# Patient Record
Sex: Male | Born: 1970
Health system: Southern US, Community
[De-identification: ages and names within clinical notes are randomized; demographics above are authoritative.]

## PROBLEM LIST (undated history)

## (undated) DIAGNOSIS — E119 Type 2 diabetes mellitus without complications: Secondary | ICD-10-CM

## (undated) DIAGNOSIS — I10 Essential (primary) hypertension: Secondary | ICD-10-CM

## (undated) HISTORY — PX: ANKLE FRACTURE SURGERY: SHX122

## (undated) HISTORY — PX: OTHER SURGICAL HISTORY: SHX169

---

## 2006-02-05 ENCOUNTER — Emergency Department: Payer: Self-pay | Admitting: Emergency Medicine

## 2006-02-08 ENCOUNTER — Ambulatory Visit: Payer: Self-pay | Admitting: Podiatry

## 2009-02-14 ENCOUNTER — Emergency Department: Payer: Self-pay | Admitting: Internal Medicine

## 2010-04-19 ENCOUNTER — Ambulatory Visit: Payer: Self-pay | Admitting: Family Medicine

## 2013-01-17 ENCOUNTER — Ambulatory Visit: Payer: Self-pay | Admitting: Emergency Medicine

## 2014-12-02 ENCOUNTER — Ambulatory Visit
Admission: EM | Admit: 2014-12-02 | Discharge: 2014-12-02 | Disposition: A | Discharge status: Home / Self Care | Payer: Managed Care, Other (non HMO) | Attending: Internal Medicine | Admitting: Internal Medicine

## 2014-12-02 ENCOUNTER — Encounter: Payer: Self-pay | Admitting: Emergency Medicine

## 2014-12-02 DIAGNOSIS — L237 Allergic contact dermatitis due to plants, except food: Secondary | ICD-10-CM

## 2014-12-02 DIAGNOSIS — H00016 Hordeolum externum left eye, unspecified eyelid: Secondary | ICD-10-CM

## 2014-12-02 DIAGNOSIS — L0100 Impetigo, unspecified: Secondary | ICD-10-CM

## 2014-12-02 MED ORDER — SULFAMETHOXAZOLE-TRIMETHOPRIM 800-160 MG PO TABS: 1.0000 | ORAL_TABLET | Freq: Two times a day (BID) | ORAL | Status: AC

## 2014-12-02 MED ORDER — PREDNISONE 50 MG PO TABS: 50.0000 mg | ORAL_TABLET | Freq: Every day | ORAL | Status: AC

## 2014-12-02 NOTE — ED Notes (Signed)
Pt reports L eye itching today, rubbed it but getting worse, L eye redness, lid closed more than R eye. Pt reports getting headache. Pt concerned poison ivy in eye as he has rash on bilat arms from poison ivy exposure.

## 2014-12-02 NOTE — ED Provider Notes (Signed)
CSN: [id number]     Arrival date & time 12/02/14  1713 History   None    Chief Complaint  Patient presents with  . Eye Problem   HPI  Patient had some itching in the left eye area in the last 24 hours, has noticed some puffiness of the left upper lid and a pimple-like area laterally. He was cutting trees this weekend, and woke up Sunday morning with blistery itchy patches on his forearms, and neck. No change in vision, no vision loss. No eye watering or mattering noted discharge. No fever, no malaise.  No past medical history on file. Past Surgical History  Procedure Laterality Date  . Arm surgery Bilateral     repair arm fractures   . Ankle fracture surgery Left    History reviewed. No pertinent family history. History  Substance Use Topics  . Smoking status: Never Smoker   . Smokeless tobacco: Not on file  . Alcohol Use: No    Review of Systems  All other systems reviewed and are negative.   Allergies  Poison ivy extract  Home Medications no home medications    Prior to Admission medications                         BP 145/97 mmHg  Pulse 90  Temp(Src) 98 F (36.7 C) (Oral)  Resp 18  Ht 5\' 7"  (1.702 m)  Wt 190 lb (86.183 kg)  BMI 29.75 kg/m2  SpO2 99% Physical Exam  Constitutional: He is oriented to person, place, and time. No distress.  Alert, nicely groomed  HENT:  Head: Atraumatic.  Eyes:  Conjugate gaze, no conjunctival redness/drainage Left upper lid is slightly puffy, especially laterally, with mild tenderness to touch. It is pointing along the lateral lid margin.  No blepharospasm   Neck: Neck supple.  Cardiovascular: Normal rate.   Pulmonary/Chest: No respiratory distress.  Abdominal: Soft. He exhibits no distension.  Musculoskeletal: Normal range of motion.  Neurological: He is alert and oriented to person, place, and time.  Skin: Skin is warm and dry.  No cyanosis Red, vesicular patches scattered over the forearms, one patch on the ventral  right forearm has some golden crusting. No facial rash.  Nursing note and vitals reviewed.   ED Course  Procedures (including critical care time) Labs Review Labs Reviewed - No data to display  Imaging Review No results found.   MDM   1. Stye external, left   2. Poison ivy dermatitis   3. Impetigo    Prescriptions for Bactrim and prednisone sent to the pharmacy. Discussed using warm packs to the left eye for 5-10 minutes twice a day, to promote drainage from the stye. Recheck if stye is not improving in a few days, or if worsening redness/drainage/swelling/pain observed.    Eustace MooreLaura W Aniyah Nobis, MD 12/02/14 [phone]

## 2014-12-02 NOTE — Discharge Instructions (Signed)
Warm packs to L upper eyelid 5-10 minutes twice daily, to help with drainage.  Prescription for bactrim (antibiotic) and prednisone (steroid) have been sent to the pharmacy.   Bactrim should be helpful for slightly infected poison ivy on arms, as well as the stye.  Sty A sty (hordeolum) is an infection of a gland in the eyelid located at the base of the eyelash. A sty may develop a white or yellow head of pus. It can be puffy (swollen). Usually, the sty will burst and pus will come out on its own. They do not leave lumps in the eyelid once they drain. A sty is often confused with another form of cyst of the eyelid called a chalazion. Chalazions occur within the eyelid and not on the edge where the bases of the eyelashes are. They often are red, sore and then form firm lumps in the eyelid. CAUSES   Germs (bacteria).  Lasting (chronic) eyelid inflammation. SYMPTOMS   Tenderness, redness and swelling along the edge of the eyelid at the base of the eyelashes.  Sometimes, there is a white or yellow head of pus. It may or may not drain. DIAGNOSIS  An ophthalmologist will be able to distinguish between a sty and a chalazion and treat the condition appropriately.  TREATMENT   Styes are typically treated with warm packs (compresses) until drainage occurs.  In rare cases, medicines that kill germs (antibiotics) may be prescribed. These antibiotics may be in the form of drops, cream or pills.  If a hard lump has formed, it is generally necessary to do a small incision and remove the hardened contents of the cyst in a minor surgical procedure done in the office.  In suspicious cases, your caregiver may send the contents of the cyst to the lab to be certain that it is not a rare, but dangerous form of cancer of the glands of the eyelid. HOME CARE INSTRUCTIONS   Wash your hands often and dry them with a clean towel. Avoid touching your eyelid. This may spread the infection to other parts of the  eye.  Apply heat to your eyelid for 10 to 20 minutes, several times a day, to ease pain and help to heal it faster.  Do not squeeze the sty. Allow it to drain on its own. Wash your eyelid carefully 3 to 4 times per day to remove any pus. SEEK IMMEDIATE MEDICAL CARE IF:   Your eye becomes painful or puffy (swollen).  Your vision changes.  Your sty does not drain by itself within 3 days.  Your sty comes back within a short period of time, even with treatment.  You have redness (inflammation) around the eye.  You have a fever. Document Released: 03/28/2005 Document Revised: 09/10/2011 Document Reviewed: 10/02/2013 [facility] Patient Information 2015 [employer], [place]. This information is not intended to replace advice given to you by your health care provider. Make sure you discuss any questions you have with your health care provider.

## 2015-03-04 IMAGING — CR RIGHT FOOT COMPLETE - 3+ VIEW
1 series · 3 of 3 positions shown · non-contrast
Comparison: none

REASON FOR EXAM: R foot pain
COMMENTS:

PROCEDURE:     MDR - MDR FOOT RT COMP W/OBLIQUES  - January 17, 2013  [DATE]
RESULT:     Comparison:  None

[Series 1: ap · 0.17mm/px · 3 of 3 slices shown]
[im 1/3]
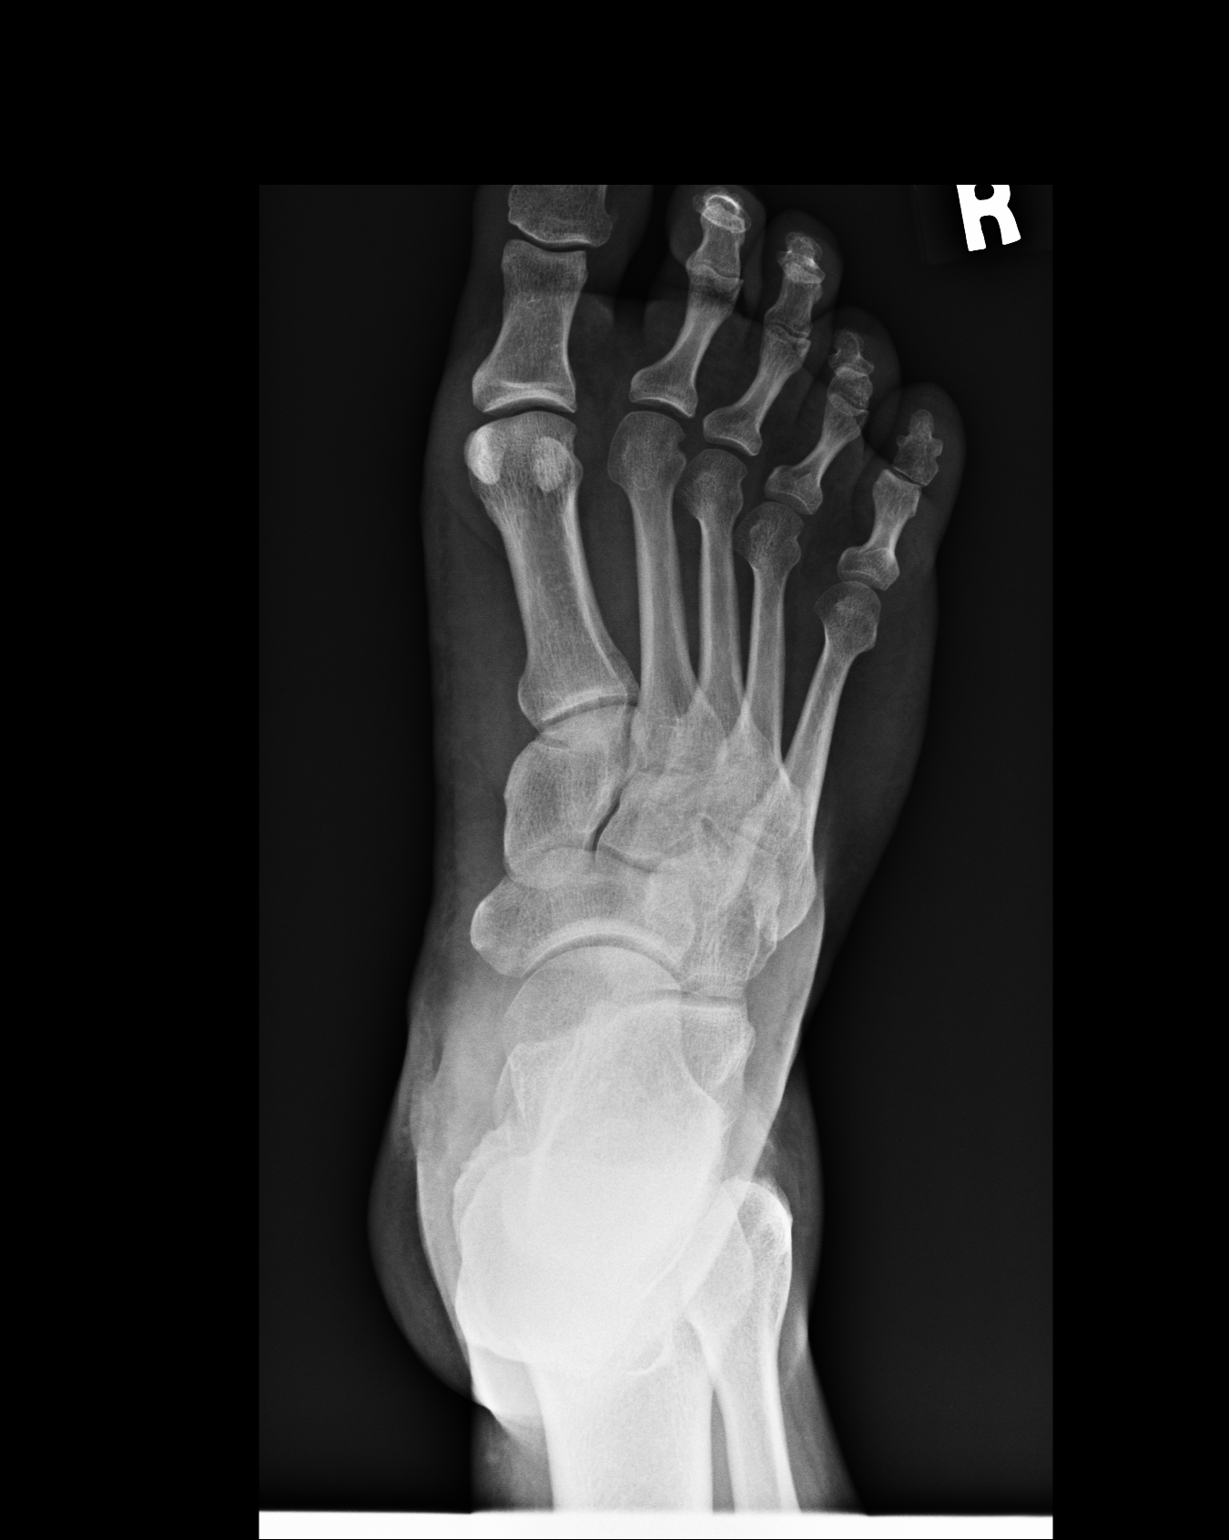
[im 2/3]
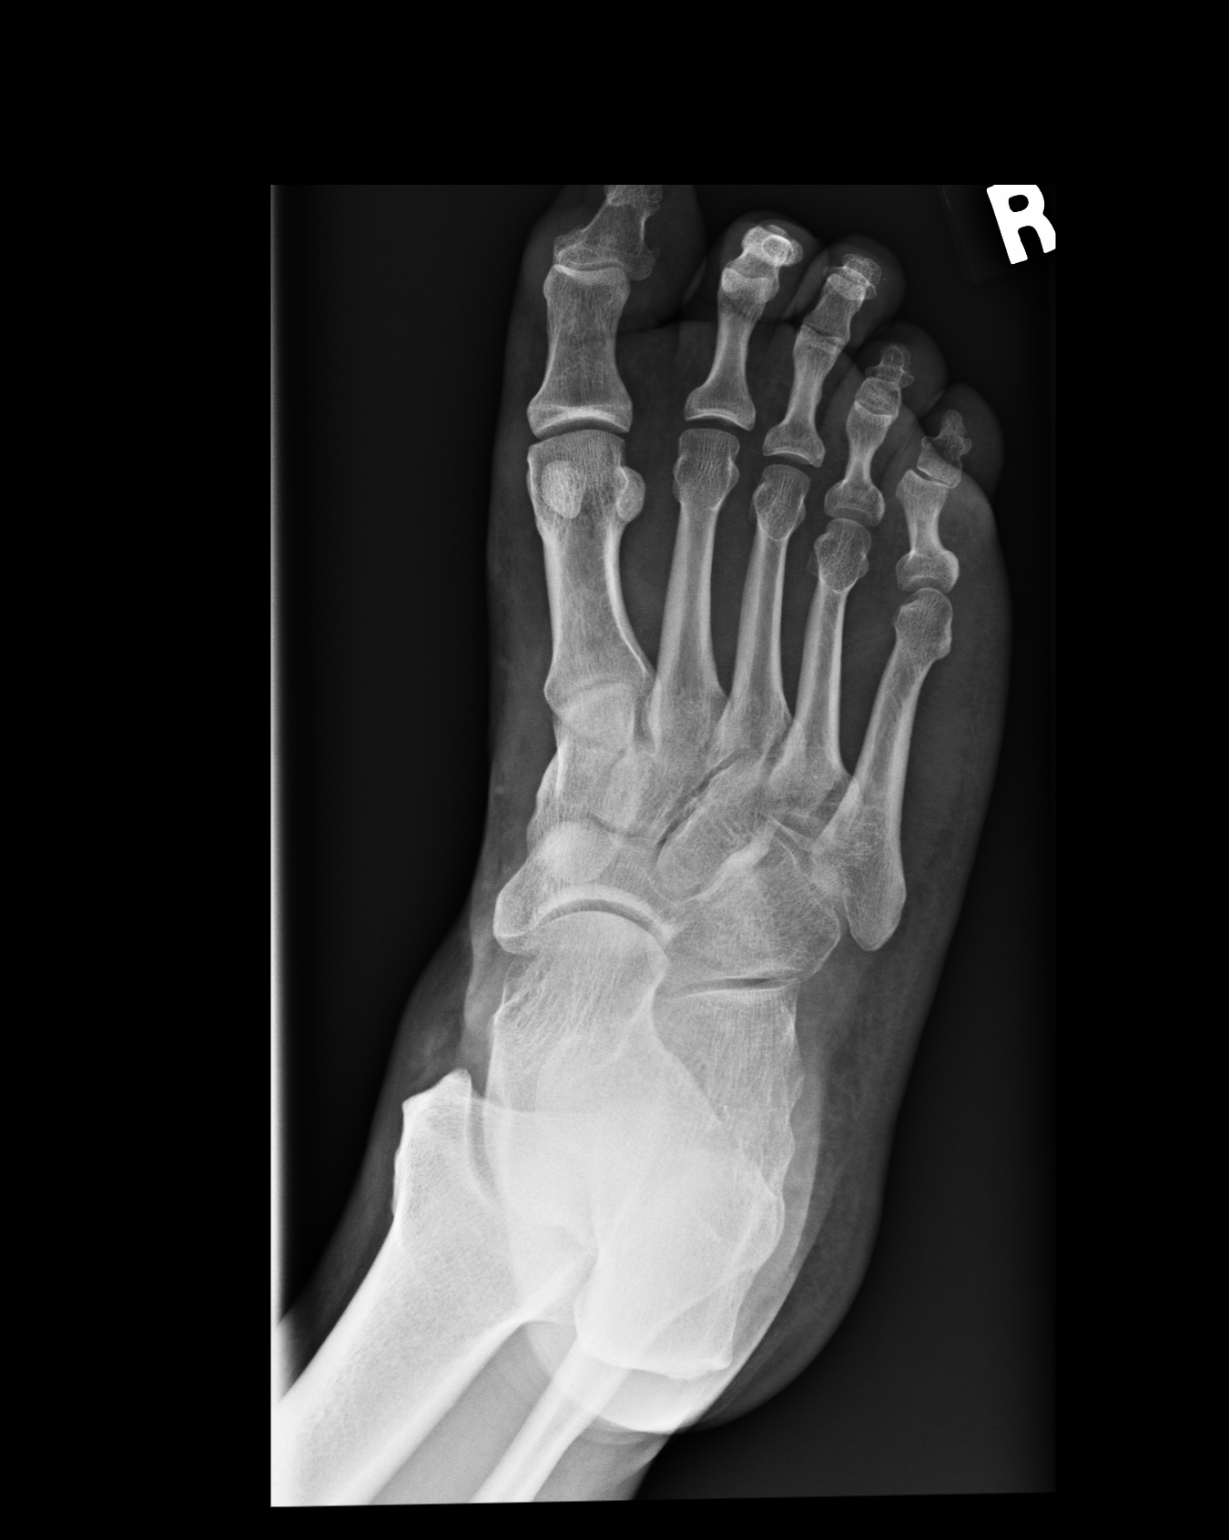
[im 3/3]
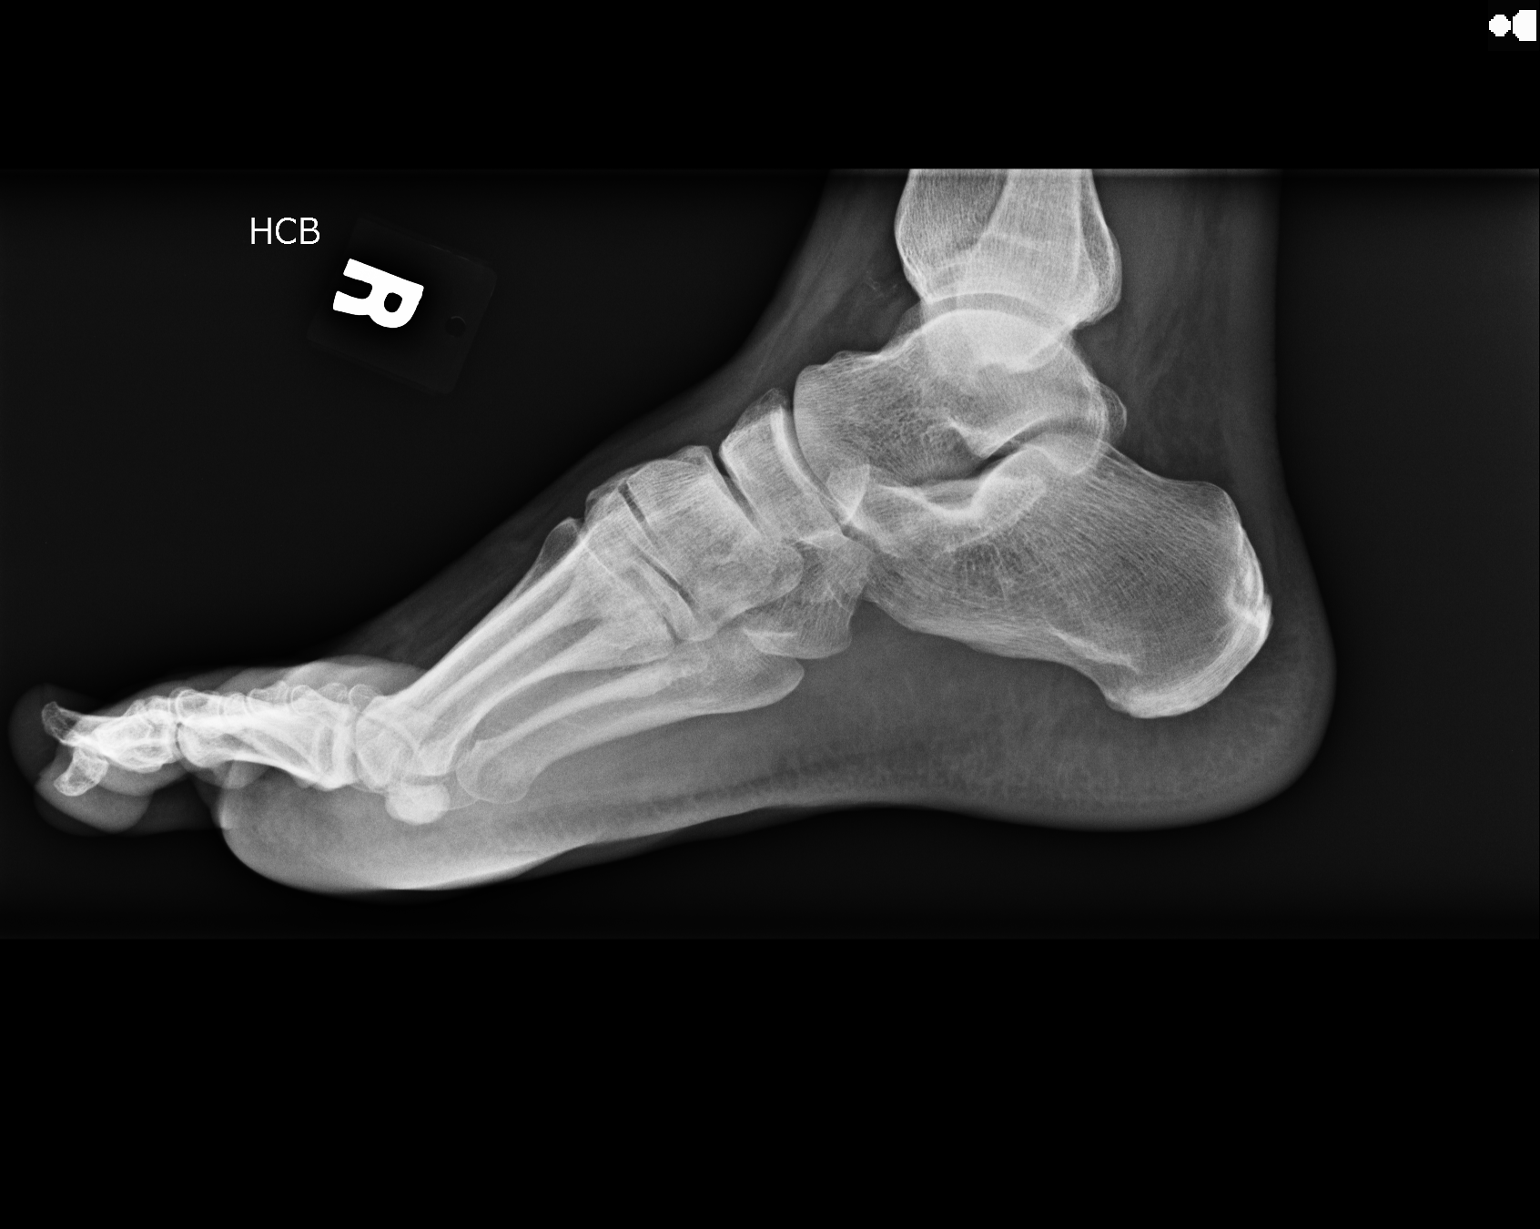

[3 of 3 positions shown; findings below may reference images not displayed]

FINDINGS: AP, oblique, and lateral views of the right foot demonstrates no fracture or
dislocation. There is no soft tissue abnormality. There is no subcutaneous
emphysema or radiopaque foreign bodies.
IMPRESSION: No acute osseous injury of the right foot.

[REDACTED]

## 2015-03-04 IMAGING — CR RIGHT ANKLE - COMPLETE 3+ VIEW
1 series · 5 of 5 positions shown · non-contrast
Comparison: none

REASON FOR EXAM: pain
COMMENTS:

PROCEDURE:     MDR - MDR ANKLE RIGHT COMPLETE  - January 17, 2013  [DATE]
RESULT:     Comparison: None

[Series 1: ap · 0.17mm/px · 5 of 5 slices shown]
[im 1/5]
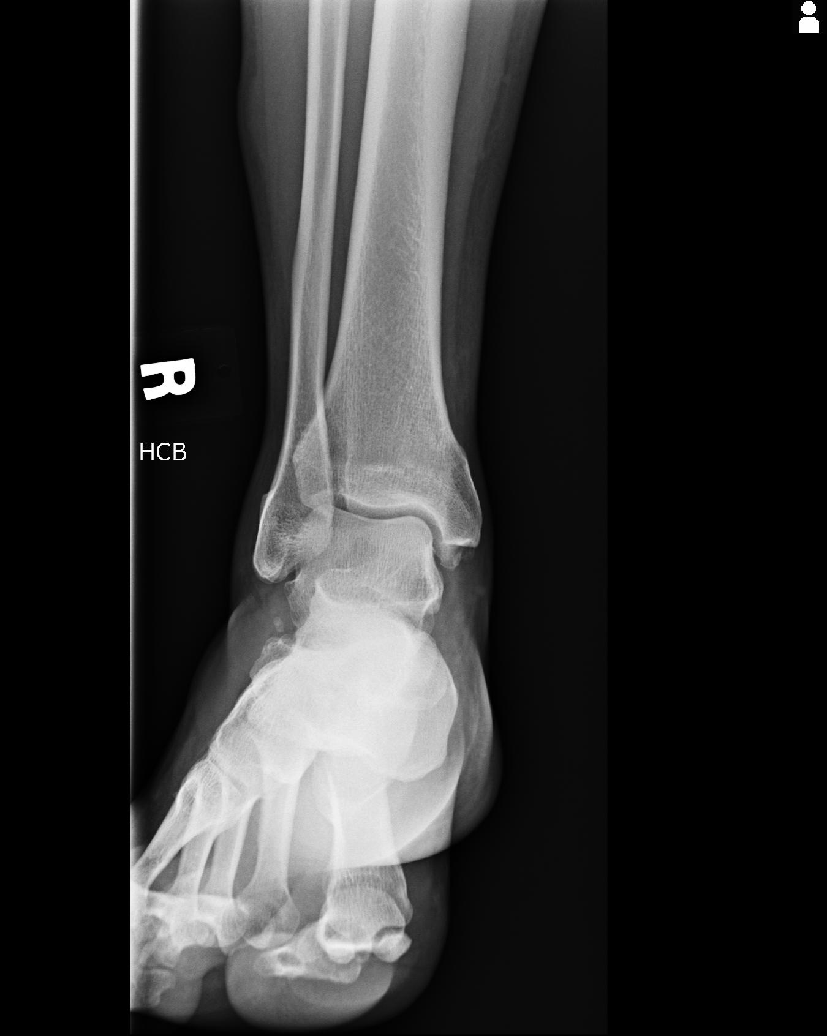
[im 2/5]
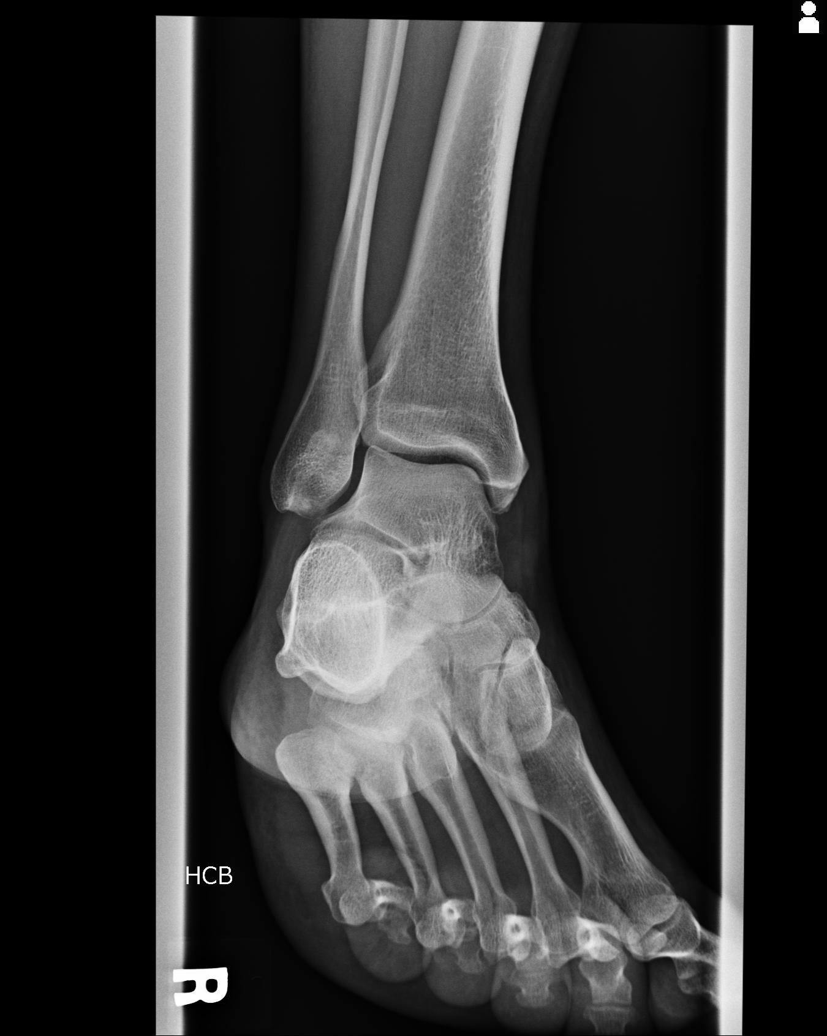
[im 3/5]
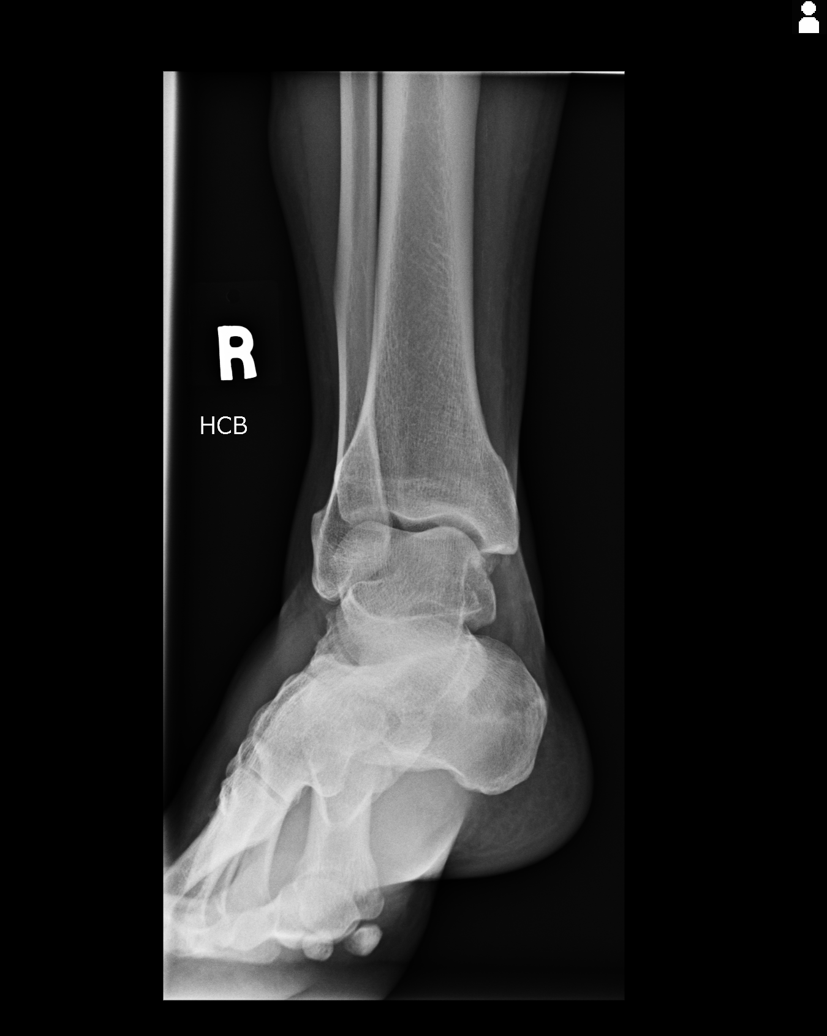
[im 4/5]
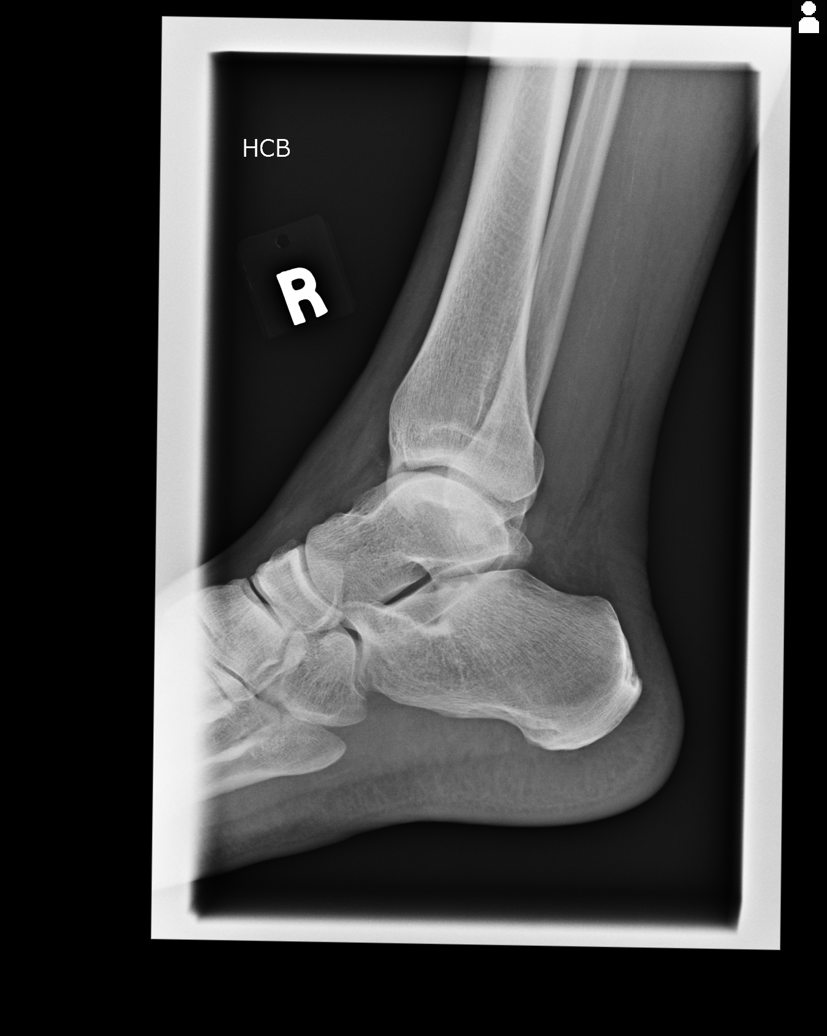
[im 5/5]
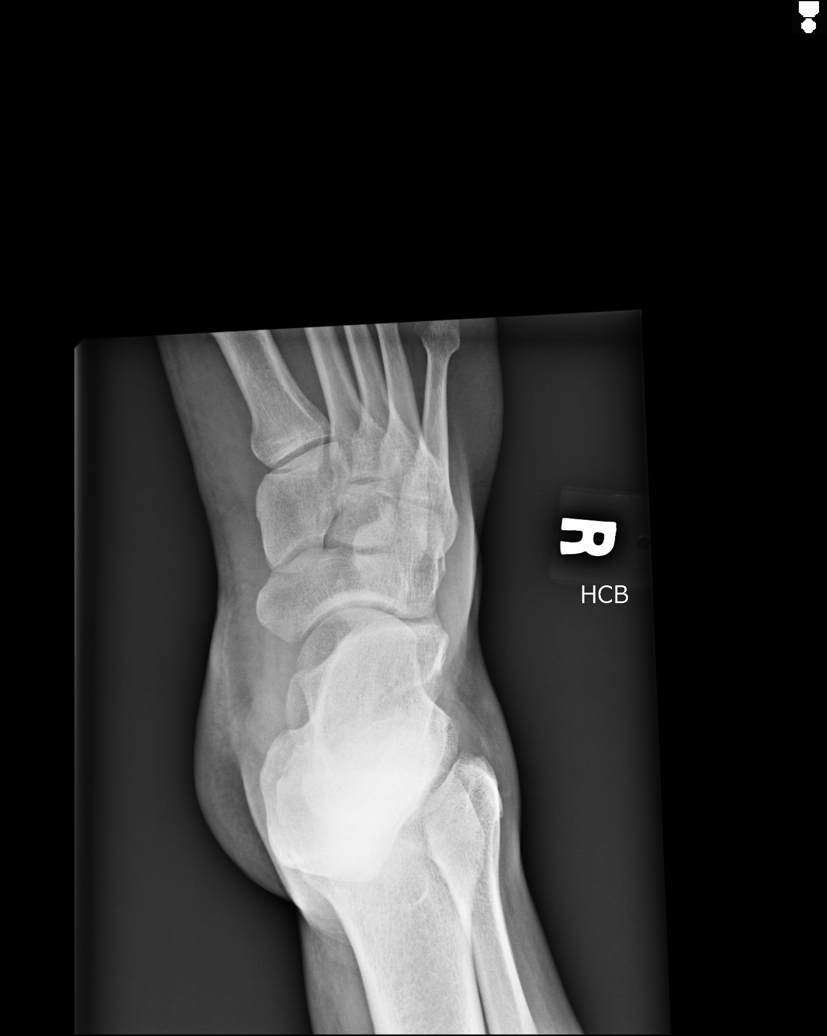

[5 of 5 positions shown; findings below may reference images not displayed]

FINDINGS: 5 views of the right ankle demonstrate no fracture or dislocation. There
ankle mortise is intact. There is no significant joint effusion. The soft
tissues are normal.
IMPRESSION: No acute osseous injury of the right ankle.

[REDACTED]

## 2018-07-02 DIAGNOSIS — E119 Type 2 diabetes mellitus without complications: Secondary | ICD-10-CM | POA: Diagnosis not present

## 2018-08-02 DIAGNOSIS — E119 Type 2 diabetes mellitus without complications: Secondary | ICD-10-CM | POA: Diagnosis not present

## 2018-08-31 DIAGNOSIS — E119 Type 2 diabetes mellitus without complications: Secondary | ICD-10-CM | POA: Diagnosis not present

## 2018-10-01 DIAGNOSIS — E119 Type 2 diabetes mellitus without complications: Secondary | ICD-10-CM | POA: Diagnosis not present

## 2019-07-13 ENCOUNTER — Telehealth: Payer: Self-pay | Admitting: Nurse Practitioner

## 2019-07-13 ENCOUNTER — Ambulatory Visit
Admission: EM | Admit: 2019-07-13 | Discharge: 2019-07-13 | Disposition: A | Discharge status: Home / Self Care | Payer: Commercial Managed Care - PPO | Attending: Emergency Medicine | Admitting: Emergency Medicine

## 2019-07-13 ENCOUNTER — Other Ambulatory Visit: Payer: Self-pay

## 2019-07-13 DIAGNOSIS — U071 COVID-19: Secondary | ICD-10-CM

## 2019-07-13 HISTORY — DX: Type 2 diabetes mellitus without complications: E11.9

## 2019-07-13 HISTORY — DX: Essential (primary) hypertension: I10

## 2019-07-13 LAB — SARS CORONAVIRUS 2 AG (30 MIN TAT): SARS Coronavirus 2 Ag: POSITIVE — AB

## 2019-07-13 MED ORDER — BENZONATATE 200 MG PO CAPS: 200.0000 mg | ORAL_CAPSULE | Freq: Three times a day (TID) | ORAL | 0 refills | Status: DC | PRN

## 2019-07-13 MED ORDER — HYDROCOD POLST-CPM POLST ER 10-8 MG/5ML PO SUER: 5.0000 mL | Freq: Two times a day (BID) | ORAL | 0 refills | Status: DC | PRN

## 2019-07-13 MED ORDER — FLUTICASONE PROPIONATE 50 MCG/ACT NA SUSP: 2.0000 | Freq: Every day | NASAL | 0 refills | Status: DC

## 2019-07-13 MED ORDER — IBUPROFEN 600 MG PO TABS: 600.0000 mg | ORAL_TABLET | Freq: Four times a day (QID) | ORAL | 0 refills | Status: DC | PRN

## 2019-07-13 NOTE — Telephone Encounter (Signed)
Called to Discuss with patient about Covid symptoms and the use of bamlanivimab, a monoclonal antibody infusion for those with mild to moderate Covid symptoms and at a high risk of hospitalization.     Pt is qualified for this infusion at the [facility] infusion center due to co-morbid conditions and/or a member of an at-risk group.       Patient declines infusion at this time. Symptoms tier reviewed as well as criteria for ending isolation. Preventative practices reviewed. Patient verbalized understanding.    Patient advised to call back if he decides that he does want to get infusion. Callback number to the infusion center given. Patient advised to go to Urgent care or ED with severe symptoms.    Symptoms started 07/12/19

## 2019-07-13 NOTE — Discharge Instructions (Addendum)
Flonase, Mucinex, saline nasal irrigation with a Lloyd Huger med rinse and distilled water as often as you want, ibuprofen 600 mg combined with 1 g of Tylenol 3-4 times a day as needed, Tessalon for the cough during the day, Tussionex for the cough at night.  Someone from the outpatient infusion clinic will be getting in touch with you to arrange an appointment if you are interested.

## 2019-07-13 NOTE — ED Provider Notes (Signed)
HPI  SUBJECTIVE:  Juan Alexander is a 49 y.o. male who presents with cough, body aches, headaches, fatigue, nasal congestion, loss of sense of smell for the past 4 days.  Denies fevers, sore throat, loss of sense of taste or shortness of breath, nausea, vomiting, diarrhea, abdominal pain.  He is not able to sleep at night secondary to the cough.  States that he has had several coworkers tested positive for [employer].  Last exposure was a week and a half ago.  He has been taking NyQuil, Mucinex D and tried ibuprofen 400 mg this morning.  The Mucinex D helps.  No aggravating factors.  No antipyretic in the past 4 to 6 hours.  No decongestants today.  He got a flu shot today.  He has a past medical history of diabetes, hypertension.  No history of coronary disease, chronic kidney disease, pulmonary disease, smoking, HIV, cancer, immunocompromise.  PMD: Dr. Payton Mccallum At [facility]    Past Medical History:  Diagnosis Date  . Diabetes mellitus without complication ([place])   . Hypertension     Past Surgical History:  Procedure Laterality Date  . ANKLE FRACTURE SURGERY Left   . arm surgery Bilateral    repair arm fractures     Family History  Problem Relation Age of Onset  . Healthy Mother   . Healthy Father     Social History   Tobacco Use  . Smoking status: Never Smoker  . Smokeless tobacco: Never Used  Substance Use Topics  . Alcohol use: No  . Drug use: Not on file    No current facility-administered medications for this encounter.  Current Outpatient Medications:  .  Cholecalciferol 50 MCG (2000 UT) CAPS, Take 2 capsules by mouth daily., Disp: , Rfl:  .  cyanocobalamin 1000 MCG tablet, Take 1 tablet by mouth daily., Disp: , Rfl:  .  empagliflozin (JARDIANCE) 25 MG TABS tablet, Take 25 mg by mouth daily with breakfast., Disp: , Rfl:  .  fenofibrate (TRICOR) 145 MG tablet, Take 1 tablet by mouth daily., Disp: , Rfl:  .  losartan (COZAAR) 25 MG tablet, Take 1 tablet by mouth  daily., Disp: , Rfl:  .  metFORMIN (GLUCOPHAGE) 1000 MG tablet, Take 1 tablet by mouth 2 (two) times daily with a meal., Disp: , Rfl:  .  Omega-3 Fatty Acids (FISH OIL) 1000 MG CAPS, Take 2 capsules by mouth 2 (two) times daily., Disp: , Rfl:  .  atorvastatin (LIPITOR) 40 MG tablet, Take 40 mg by mouth daily., Disp: , Rfl:  .  benzonatate (TESSALON) 200 MG capsule, Take 1 capsule (200 mg total) by mouth 3 (three) times daily as needed for cough., Disp: 30 capsule, Rfl: 0 .  chlorpheniramine-HYDROcodone (TUSSIONEX PENNKINETIC ER) 10-8 MG/5ML SUER, Take 5 mLs by mouth every 12 (twelve) hours as needed for cough., Disp: 60 mL, Rfl: 0 .  fluticasone (FLONASE) 50 MCG/ACT nasal spray, Place 2 sprays into both nostrils daily., Disp: 16 g, Rfl: 0 .  ibuprofen (ADVIL) 600 MG tablet, Take 1 tablet (600 mg total) by mouth every 6 (six) hours as needed., Disp: 30 tablet, Rfl: 0  Allergies  Allergen Reactions  . Poison Ivy Extract [Poison Ivy Extract] Rash     ROS  As noted in HPI.   Physical Exam  BP (!) 134/97 (BP Location: Left Arm)   Pulse (!) 115   Temp 99.1 F (37.3 C) (Oral)   Ht '5\' 7"'$  (1.702 m)   Wt 83 kg  SpO2 98%   BMI 28.66 kg/m   Constitutional: Well developed, well nourished, no acute distress Eyes:  EOMI, conjunctiva normal bilaterally HENT: Normocephalic, atraumatic,mucus membranes moist.  Positive nasal congestion.  Normal turbinates.  No sinus tenderness.  Positive postnasal drip. Respiratory: Normal inspiratory effort lungs clear bilaterally  cardiovascular: Regular tachycardia no murmurs rubs or gallop GI: nondistended skin: No rash, skin intact Musculoskeletal: no deformities Neurologic: Alert & oriented x 3, no focal neuro deficits Psychiatric: Speech and behavior appropriate   ED Course   Medications - No data to display  Orders Placed This Encounter  Procedures  . Novel Coronavirus, NAA (Hosp order, Send-out to Ref Lab; TAT 18-24 hrs    Standing Status:    Standing    Number of Occurrences:   1    Order Specific Question:   Is this test for diagnosis or screening    Answer:   Screening    Order Specific Question:   Symptomatic for COVID-19 as defined by CDC    Answer:   No    Order Specific Question:   Hospitalized for COVID-19    Answer:   No    Order Specific Question:   Admitted to ICU for COVID-19    Answer:   No    Order Specific Question:   Previously tested for COVID-19    Answer:   No    Order Specific Question:   Resident in a congregate (group) care setting    Answer:   No    Order Specific Question:   Employed in healthcare setting    Answer:   No  . SARS Coronavirus 2 Ag (30 min TAT) - Nasal Swab (BD Veritor Kit)    Standing Status:   Standing    Number of Occurrences:   1    Order Specific Question:   Is this test for diagnosis or screening    Answer:   Diagnosis of ill patient    Order Specific Question:   Symptomatic for COVID-19 as defined by CDC    Answer:   Yes    Order Specific Question:   Date of Symptom Onset    Answer:   07/10/2019    Order Specific Question:   Hospitalized for COVID-19    Answer:   No    Order Specific Question:   Admitted to ICU for COVID-19    Answer:   No    Order Specific Question:   Previously tested for COVID-19    Answer:   Yes    Order Specific Question:   Resident in a congregate (group) care setting    Answer:   No    Order Specific Question:   Employed in healthcare setting    Answer:   No  . Airborne and Contact precautions    Standing Status:   Standing    Number of Occurrences:   1    Results for orders placed or performed during the hospital encounter of 07/13/19 (from the past 24 hour(s))  SARS Coronavirus 2 Ag (30 min TAT) - Nasal Swab (BD Veritor Kit)     Status: Abnormal   Collection Time: 07/13/19 12:26 PM   Specimen: Nasal Swab (BD Veritor Kit)  Result Value Ref Range   SARS Coronavirus 2 Ag POSITIVE (A) NEGATIVE   No results found.  ED Clinical Impression  1.  COVID-19 virus infection      ED Assessment/Plan  [place] Narcotic database reviewed for this patient, and feel that the risk/benefit  ratio today is favorable for proceeding with a prescription for controlled substance.  No opiate prescriptions in 2 years.  Rapid Covid positive.  Home with supportive treatment Flonase, Mucinex Mucinex D, saline nasal irrigation, ibuprofen 600 mg combined with 1 g of Tylenol 3-4 times a day as needed, Tessalon for the cough during the day, Tussionex for the cough at night.  Follow-up with PMD as needed, to the ER if he gets worse.  he qualifies for infusion clinic due to diabetes hypertension.  Called hotline left message  Discussed labs,  MDM, treatment plan, and plan for follow-up with patient. Discussed sn/sx that should prompt return to the ED. patient agrees with plan.   Meds ordered this encounter  Medications  . fluticasone (FLONASE) 50 MCG/ACT nasal spray    Sig: Place 2 sprays into both nostrils daily.    Dispense:  16 g    Refill:  0  . chlorpheniramine-HYDROcodone (TUSSIONEX PENNKINETIC ER) 10-8 MG/5ML SUER    Sig: Take 5 mLs by mouth every 12 (twelve) hours as needed for cough.    Dispense:  60 mL    Refill:  0  . benzonatate (TESSALON) 200 MG capsule    Sig: Take 1 capsule (200 mg total) by mouth 3 (three) times daily as needed for cough.    Dispense:  30 capsule    Refill:  0  . ibuprofen (ADVIL) 600 MG tablet    Sig: Take 1 tablet (600 mg total) by mouth every 6 (six) hours as needed.    Dispense:  30 tablet    Refill:  0    *This clinic note was created using [employer]. Therefore, there may be occasional mistakes despite careful proofreading.   ?    Melynda Ripple, MD 07/14/19 1100

## 2019-07-13 NOTE — ED Triage Notes (Signed)
Pt presents with c/o dry cough for several days, nasal congestion, decreased sense of smell, possible sweats and body aches. He does work for [employer] and has been around coworkers that have tested positive for COVID.

## 2022-06-15 ENCOUNTER — Encounter: Payer: Self-pay | Admitting: Urology

## 2022-06-15 ENCOUNTER — Ambulatory Visit: Payer: Commercial Managed Care - PPO | Admitting: Urology

## 2022-06-15 VITALS — Ht 67.0 in | Wt 185.0 lb

## 2022-06-15 DIAGNOSIS — Z3009 Encounter for other general counseling and advice on contraception: Secondary | ICD-10-CM | POA: Diagnosis not present

## 2022-06-15 DIAGNOSIS — N4889 Other specified disorders of penis: Secondary | ICD-10-CM | POA: Diagnosis not present

## 2022-06-15 MED ORDER — DIAZEPAM 10 MG PO TABS: 10.0000 mg | ORAL_TABLET | Freq: Once | ORAL | 0 refills | Status: AC

## 2022-06-15 NOTE — Progress Notes (Signed)
06/15/2022 9:34 AM   Juan Alexander 1970-08-20 [id number]  Referring provider: No referring provider defined for this encounter.  Chief Complaint  Patient presents with   VAS    HPI: 51 y.o. year old male referred for further evaluation of possible vasectomy.  He reports that this is second marriage.  He is to stepchildren and his wife is younger, 47 years old.  He wonders today if he has infertility issues.  He and his first wife tried to have kids for quite some time but she was diagnosed with endometriosis.  Wonders if it is a male factor.  He also mentions today that he has had penile pain with erections and ejaculation for about the past month.  This all started when he kicked a box at work and he developed groin pain in his right inguinal area.  He denies any bulging.  He was checked by his physical therapist at work and was told that he just had a groin pull.  It hurts in the same area when he gets an erection or ejaculates.  He has not tried any NSAIDs or any other intervention.   PMH: Past Medical History:  Diagnosis Date   Diabetes mellitus without complication (HCC)    Hypertension     Surgical History: Past Surgical History:  Procedure Laterality Date   ANKLE FRACTURE SURGERY Left    arm surgery Bilateral    repair arm fractures     Home Medications:  Allergies as of 06/15/2022       Reactions   Poison Ivy Extract [poison Ivy Extract] Rash        Medication List        Accurate as of June 15, 2022  9:34 AM. If you have any questions, ask your nurse or doctor.          STOP taking these medications    atorvastatin 40 MG tablet Commonly known as: LIPITOR Stopped by: Vanna Scotland, MD   benzonatate 200 MG capsule Commonly known as: TESSALON Stopped by: Vanna Scotland, MD   chlorpheniramine-HYDROcodone 10-8 MG/5ML Suer Commonly known as: Tussionex Pennkinetic ER Stopped by: Vanna Scotland, MD   Cholecalciferol 50 MCG (2000 UT)  Caps Stopped by: Vanna Scotland, MD   cyanocobalamin 1000 MCG tablet Stopped by: Vanna Scotland, MD   fenofibrate 145 MG tablet Commonly known as: TRICOR Stopped by: Vanna Scotland, MD   fluticasone 50 MCG/ACT nasal spray Commonly known as: FLONASE Stopped by: Vanna Scotland, MD   ibuprofen 600 MG tablet Commonly known as: ADVIL Stopped by: Vanna Scotland, MD   losartan 25 MG tablet Commonly known as: COZAAR Stopped by: Vanna Scotland, MD       TAKE these medications    ezetimibe-simvastatin 10-80 MG tablet Commonly known as: VYTORIN Take 1 tablet by mouth at bedtime.   metFORMIN 1000 MG tablet Commonly known as: GLUCOPHAGE Take 1 tablet by mouth 2 (two) times daily with a meal.   omega-3 acid ethyl esters 1 g capsule Commonly known as: LOVAZA Take 1 g by mouth daily as needed.   Semaglutide (1 MG/DOSE) 4 MG/3ML Sopn Inject 0.75 mLs into the skin 2 (two) times a week.        Allergies:  Allergies  Allergen Reactions   Poison Ivy Extract [Poison Ivy Extract] Rash    Family History: Family History  Problem Relation Age of Onset   Healthy Mother    Healthy Father     Social History:  reports that he has never  smoked. He has never used smokeless tobacco. He reports that he does not drink alcohol. No history on file for drug use.   Physical Exam: Ht 5\' 7"  (1.702 m)   Wt 185 lb (83.9 kg)   BMI 28.98 kg/m   Constitutional:  Alert and oriented, No acute distress. HEENT: [place] AT, moist mucus membranes.  Trachea midline, no masses. Cardiovascular: No clubbing, cyanosis, or edema. Respiratory: Normal respiratory effort, no increased work of breathing. GI: Abdomen is soft, nontender, nondistended, no abdominal masses GU: Normal phallus.  Bilateral descended testicles without masses.  Vasa easily palpable bilaterally. Skin: No rashes, bruises or suspicious lesions.  Noticed over the right inguinal area without obvious bulging. Neurologic: Grossly intact, no  focal deficits, moving all 4 extremities. Psychiatric: Normal mood and affect.   Assessment & Plan:    1. Vasectomy evaluation Today, we discussed what the vas deferens is, where it is located, and its function. We reviewed the procedure for vasectomy, it's risks, benefits, alternatives, and likelihood of achieving his goals. We discussed in detail the procedure, complications, and recovery as well as the need for clearance prior to unprotected intercourse. We discussed that vasectomy does not protect against sexually transmitted diseases. We discussed that this procedure does not result in immediate sterility and that they would need to use other forms of birth control until he has been cleared with negative postvasectomy semen analyses. I explained that the procedure is considered to be permanent and that attempts at reversal have varying degrees of success. These options include vasectomy reversal, sperm retrieval, and in vitro fertilization; these can be very expensive. We discussed the chance of postvasectomy pain syndrome which occurs in less than 5% of patients. I explained to the patient that there is no treatment to resolve this chronic pain, and that if it developed I would not be able to help resolve the issue, but that surgery is generally not needed for correction. I explained there have even been reports of systemic like illness associated with this chronic pain, and that there was no good cure. I explained that vasectomy it is not a 100% reliable form of birth control, and the risk of pregnancy after vasectomy is approximately 1 in 2000 men who had a negative postvasectomy semen analysis or rare non-motile sperm. I explained that repeat vasectomy was necessary in less than 1% of vasectomy procedures when employing the type of technique that I use. I explained that he should refrain from ejaculation for approximately one week following vasectomy. I explained that there are other options for  birth control which are permanent and non-permanent; we discussed these. I explained the rates of surgical complications, such as symptomatic hematoma or infection, are low (1-2%) and vary with the surgeon's experience and criteria used to diagnose the complication.   The patient had the opportunity to ask questions to his stated satisfaction. He voiced understanding of the above factors and stated that he has read all the information provided to him and the packets and informed consent.  He is interested in receiving of Valium 10 mg prior to the procedure for the purpose of anxiolysis.  A prescription was given today.  He will have a driver on the day of the procedure.   2. Penile pain Suspect he has a "sports hernia" related to his trauma.  Recommend supportive care and NSAIDs as tolerated hold for GI distress.  Would avoid sexual activity until this area is nontender.  He is agreeable this plan.   Schedule  vasectomy  Vanna Scotland, MD  [facility] [address] [place], [place] [address] [phone]

## 2022-07-01 ENCOUNTER — Ambulatory Visit
Admission: EM | Admit: 2022-07-01 | Discharge: 2022-07-01 | Disposition: A | Discharge status: Home / Self Care | Payer: Commercial Managed Care - PPO | Attending: Family Medicine | Admitting: Family Medicine

## 2022-07-01 ENCOUNTER — Encounter: Payer: Self-pay | Admitting: Emergency Medicine

## 2022-07-01 DIAGNOSIS — K047 Periapical abscess without sinus: Secondary | ICD-10-CM

## 2022-07-01 MED ORDER — HYDROCODONE-ACETAMINOPHEN 5-325 MG PO TABS: 2.0000 | ORAL_TABLET | ORAL | 0 refills | Status: DC | PRN

## 2022-07-01 MED ORDER — PENICILLIN V POTASSIUM 500 MG PO TABS: 500.0000 mg | ORAL_TABLET | Freq: Four times a day (QID) | ORAL | 0 refills | Status: AC

## 2022-07-01 NOTE — Discharge Instructions (Signed)
At this time there is no abscess to be drained. You were prescribed an antibiotic. Please take this exactly as directed and do not stop taking it until the entire course of medicine is finished, even if you begin to feel better before finishing the course. We have also given you pain medication; use 1000 mg Tylenol and 600 mg of Ibuprofen, please take this only as prescribed for severe pain, and do not drink or drive while taking this medication. Follow up with your area dentist as needed.  Schedule an appointment with your dentist or call local dentists to see if they take your insurance or can do a payment payment plan.  [employer], [employer] are options. Consider [facility] prior to your dental appointment.    Go to ED for red flag symptoms, including; fevers you cannot reduce with Tylenol/Motrin, severe headaches, vision changes, numbness/weakness in part of the body, intractable vomiting, severe dehydration, signs of severe infection (increased redness, swelling of an area), feeling faint or passing out, dizziness, etc. You should especially go to the ED for sudden acute worsening of condition if you do not elect to go at this time.

## 2022-07-01 NOTE — ED Provider Notes (Signed)
[facility]    CSN: [id number] Arrival date & time: 07/01/22  [id number]      History   Chief Complaint Chief Complaint  Patient presents with   Dental Pain    HPI Juan Alexander is a 51 y.o. male.   HPI   Juan Alexander presents for dental pain for the past 2-3 days.  Pain is radiating to his sinuses.   Had a crown placed on his tooth 3 months ago.  He was told that he may need a root canal on that tooth. Has throbbing pain that kept him up all night.  Used Orajel, took some Tylenol and ibuprofen without relief.  He called an area dentist that couldn't see him.  He is flying out of town on Tuesday for a business trip.  No fever,  neck pain, nausea or vomiting.  Endorses jaw pain and headache.      Past Medical History:  Diagnosis Date   Diabetes mellitus without complication ([place])    Hypertension     There are no problems to display for this patient.   Past Surgical History:  Procedure Laterality Date   ANKLE FRACTURE SURGERY Left    arm surgery Bilateral    repair arm fractures        Home Medications    Prior to Admission medications   Medication Sig Start Date End Date Taking? Authorizing Provider  ezetimibe-simvastatin (VYTORIN) 10-80 MG tablet Take 1 tablet by mouth at bedtime.   Yes [provider]  HYDROcodone-acetaminophen (NORCO/VICODIN) 5-325 MG tablet Take 2 tablets by mouth every 4 (four) hours as needed. 07/01/22  Yes Clarice Bonaventure, Ronnette Juniper, DO  metFORMIN (GLUCOPHAGE) 1000 MG tablet Take 1 tablet by mouth 2 (two) times daily with a meal. 03/31/19  Yes [provider]  omega-3 acid ethyl esters (LOVAZA) 1 g capsule Take 1 g by mouth daily as needed.   Yes [provider]  penicillin v potassium (VEETID) 500 MG tablet Take 1 tablet (500 mg total) by mouth 4 (four) times daily for 10 days. 07/01/22 07/11/22 Yes Lynel Forester, DO  Semaglutide, 1 MG/DOSE, 4 MG/3ML SOPN Inject 0.75 mLs into the skin 2 (two) times a week. 05/17/22  Yes  [provider]    Family History Family History  Problem Relation Age of Onset   Healthy Mother    Healthy Father     Social History Social History   Tobacco Use   Smoking status: Never   Smokeless tobacco: Never  Vaping Use   Vaping Use: Never used  Substance Use Topics   Alcohol use: No   Drug use: Never     Allergies   Poison ivy extract [poison ivy extract]   Review of Systems Review of Systems: negative unless otherwise stated in HPI.      Physical Exam Triage Vital Signs ED Triage Vitals  Enc Vitals Group     BP 07/01/22 0833 (!) 157/90     Pulse Rate 07/01/22 0833 84     Resp 07/01/22 0833 15     Temp 07/01/22 0833 97.9 F (36.6 C)     Temp Source 07/01/22 0833 Oral     SpO2 07/01/22 0833 100 %     Weight 07/01/22 0830 184 lb 15.5 oz (83.9 kg)     Height 07/01/22 0830 5\' 7"  (1.702 m)     Head Circumference --      Peak Flow --      Pain Score 07/01/22 0830 6  Pain Loc --      Pain Edu? --      Excl. in GC? --    No data found.  Updated Vital Signs BP (!) 157/90 (BP Location: Left Arm)   Pulse 84   Temp 97.9 F (36.6 C) (Oral)   Resp 15   Ht 5\' 7"  (1.702 m)   Wt 83.9 kg   SpO2 100%   BMI 28.97 kg/m   Visual Acuity Right Eye Distance:   Left Eye Distance:   Bilateral Distance:    Right Eye Near:   Left Eye Near:    Bilateral Near:     Physical Exam GEN:     alert,  male in no distress    HENT:  mucus membranes moist, posterior pharynx without lesions exudates or erythema, no nasal discharge,  left incisor tender to percussion with gum bulging and erythema, small area of purulence noted, no fluctuance, no trismus, no secretion pooling, no palpable induration and no visible swelling of the floor the mouth, overall ok dentition,  normal jaw movement without difficulty EYES:   pupils equal and reactive, no scleral injection or discharge NECK:  normal ROM, no lymphadenopathy, no meningismus   RESP:  no increased work of  breathing CVS:   regular rate  Skin:   warm and dry, no rash or skin changes of on external jaw      UC Treatments / Results  Labs (all labs ordered are listed, but only abnormal results are displayed) Labs Reviewed - No data to display  EKG   Radiology No results found.  Procedures Procedures (including critical care time)  Medications Ordered in UC Medications - No data to display  Initial Impression / Assessment and Plan / UC Course  I have reviewed the triage vital signs and the nursing notes.  Pertinent labs & imaging results that were available during my care of the patient were reviewed by me and considered in my medical decision making (see chart for details).      Pt is a 51 y.o. male who presents for 3 days of dental pain. Juan Alexander is afebrile here without recent antipyretics. Satting well on room air. He is hypertensive and notes he didn't take his losartan today.  Overall pt is well appearing, well hydrated, without respiratory distress.  Dental exam concerning for dental abscess. - continue Tylenol with Motrin as needed for mild to moderate discomfort - Norco prescribed for severe pain - Gargle with salt water several times a day - He is to call his dentist to schedule appointment for when he returns  - Discussed  ED precautions, understanding voiced.   Discussed MDM, treatment plan and plan for follow-up with patient who agrees with plan.    Final Clinical Impressions(s) / UC Diagnoses   Final diagnoses:  Dental abscess     Discharge Instructions      At this time there is no abscess to be drained. You were prescribed an antibiotic. Please take this exactly as directed and do not stop taking it until the entire course of medicine is finished, even if you begin to feel better before finishing the course. We have also given you pain medication; use 1000 mg Tylenol and 600 mg of Ibuprofen, please take this only as prescribed for severe pain, and do not  drink or drive while taking this medication. Follow up with your area dentist as needed.  Schedule an appointment with your dentist or call local dentists to see if they  take your insurance or can do a payment payment plan.  [employer] [facility] are options. Consider [facility] prior to your dental appointment.    Go to ED for red flag symptoms, including; fevers you cannot reduce with Tylenol/Motrin, severe headaches, vision changes, numbness/weakness in part of the body, intractable vomiting, severe dehydration, signs of severe infection (increased redness, swelling of an area), feeling faint or passing out, dizziness, etc. You should especially go to the ED for sudden acute worsening of condition if you do not elect to go at this time.       ED Prescriptions     Medication Sig Dispense Auth. Provider   penicillin v potassium (VEETID) 500 MG tablet Take 1 tablet (500 mg total) by mouth 4 (four) times daily for 10 days. 40 tablet Dalinda Heidt, DO   HYDROcodone-acetaminophen (NORCO/VICODIN) 5-325 MG tablet Take 2 tablets by mouth every 4 (four) hours as needed. 10 tablet Lyndee Hensen, DO      I have reviewed the PDMP during this encounter.   Lyndee Hensen, DO 07/01/22 [id number]

## 2022-07-01 NOTE — ED Triage Notes (Signed)
Patient c/o upper front tooth pain that started last Friday and has gotten worse.  Patient had crown placed on that tooth about 3 months. Patient denies fevers.

## 2022-08-03 ENCOUNTER — Ambulatory Visit: Payer: Commercial Managed Care - PPO | Admitting: Urology

## 2022-08-03 ENCOUNTER — Encounter: Payer: Self-pay | Admitting: Urology

## 2022-08-03 VITALS — BP 127/82 | HR 102 | Ht 67.0 in | Wt 189.0 lb

## 2022-08-03 DIAGNOSIS — Z302 Encounter for sterilization: Secondary | ICD-10-CM | POA: Diagnosis not present

## 2022-08-03 NOTE — Progress Notes (Signed)
08/03/22  CC:  Chief Complaint  Patient presents with   VAS    HPI: 52 year old male who presents today for vasectomy.  Blood pressure 127/82, pulse (!) 102, height 5\' 7"  (1.702 m), weight 189 lb (85.7 kg). NED. A&Ox3.   No respiratory distress   Abd soft, NT, ND Normal external genitalia with patent urethral meatus  A timeout was performed.  Patient's identity and consent was confirmed.  All questions were answered.   Bilateral Vasectomy Procedure  Pre-Procedure: - Patient's scrotum was prepped and draped for vasectomy. - The vas was palpated through the scrotal skin on the left. - 1% Xylocaine was injected into the skin and surrounding tissue for placement  - In a similar manner, the vas on the right was identified, anesthetized, and stabilized.  Procedure: - A #11 blade was used to make a small stab incision in the skin overlying the vas - The left vas was isolated and brought up through the incision exposing that structure. - Bleeding points were cauterized as they occurred. - The vas was free from the surrounding structures and brought to the view. - A segment was positioned for placement with a hemostat. - A second hemostat was placed and a small segment between the two hemostats and was removed for inspection. - Each end of the transected vas lumen was fulgurated/ obliterated using needlepoint electrocautery -A fascial interposition was performed on testicular end of the vas using #3-0 chromic suture -The same procedure was performed on the right. - A single suture of #3-0 chromic catgut was used to close each lateral scrotal skin incision - A dressing was applied.  Post-Procedure: - Patient was instructed in care of the operative area - A specimen is to be delivered in 12 weeks   -Another form of contraception is to be used until post vasectomy semen analysis  Hollice Espy, MD

## 2022-08-03 NOTE — Patient Instructions (Signed)

## 2022-11-02 ENCOUNTER — Other Ambulatory Visit: Payer: Commercial Managed Care - PPO

## 2022-11-02 DIAGNOSIS — Z302 Encounter for sterilization: Secondary | ICD-10-CM

## 2022-11-04 LAB — POST-VAS SPERM EVALUATION,QUAL: Volume: 0.3 mL
